# Patient Record
Sex: Female | Born: 1955 | Race: Asian | Hispanic: No | Smoking: Never smoker
Health system: Southern US, Community
[De-identification: ages and names within clinical notes are randomized; demographics above are authoritative.]

## PROBLEM LIST (undated history)

## (undated) DIAGNOSIS — R51 Headache: Secondary | ICD-10-CM

## (undated) DIAGNOSIS — I10 Essential (primary) hypertension: Secondary | ICD-10-CM

## (undated) DIAGNOSIS — S5291XA Unspecified fracture of right forearm, initial encounter for closed fracture: Secondary | ICD-10-CM

## (undated) HISTORY — PX: ABDOMINAL HYSTERECTOMY: SHX81

---

## 1998-02-20 ENCOUNTER — Other Ambulatory Visit: Admission: RE | Admit: 1998-02-20 | Discharge: 1998-02-20 | Payer: Self-pay | Admitting: *Deleted

## 1998-04-10 ENCOUNTER — Ambulatory Visit (HOSPITAL_COMMUNITY): Admission: RE | Admit: 1998-04-10 | Discharge: 1998-04-10 | Payer: Self-pay | Admitting: *Deleted

## 1998-06-05 ENCOUNTER — Ambulatory Visit (HOSPITAL_COMMUNITY): Admission: RE | Admit: 1998-06-05 | Discharge: 1998-06-05 | Payer: Self-pay | Admitting: *Deleted

## 1999-06-24 ENCOUNTER — Other Ambulatory Visit: Admission: RE | Admit: 1999-06-24 | Discharge: 1999-06-24 | Payer: Self-pay | Admitting: *Deleted

## 1999-07-03 ENCOUNTER — Encounter: Payer: Self-pay | Admitting: *Deleted

## 1999-07-03 ENCOUNTER — Ambulatory Visit (HOSPITAL_COMMUNITY): Admission: RE | Admit: 1999-07-03 | Discharge: 1999-07-03 | Payer: Self-pay | Admitting: *Deleted

## 2000-02-25 ENCOUNTER — Encounter: Payer: Self-pay | Admitting: Emergency Medicine

## 2000-02-25 ENCOUNTER — Emergency Department (HOSPITAL_COMMUNITY): Admission: EM | Admit: 2000-02-25 | Discharge: 2000-02-25 | Payer: Self-pay | Admitting: Emergency Medicine

## 2000-06-30 ENCOUNTER — Other Ambulatory Visit: Admission: RE | Admit: 2000-06-30 | Discharge: 2000-06-30 | Payer: Self-pay | Admitting: *Deleted

## 2000-07-03 ENCOUNTER — Encounter: Payer: Self-pay | Admitting: *Deleted

## 2000-07-03 ENCOUNTER — Ambulatory Visit (HOSPITAL_COMMUNITY): Admission: RE | Admit: 2000-07-03 | Discharge: 2000-07-03 | Payer: Self-pay | Admitting: *Deleted

## 2001-07-08 ENCOUNTER — Other Ambulatory Visit: Admission: RE | Admit: 2001-07-08 | Discharge: 2001-07-08 | Payer: Self-pay | Admitting: *Deleted

## 2001-09-14 ENCOUNTER — Ambulatory Visit (HOSPITAL_COMMUNITY): Admission: RE | Admit: 2001-09-14 | Discharge: 2001-09-14 | Payer: Self-pay | Admitting: *Deleted

## 2001-09-14 ENCOUNTER — Encounter: Payer: Self-pay | Admitting: *Deleted

## 2002-07-04 ENCOUNTER — Other Ambulatory Visit: Admission: RE | Admit: 2002-07-04 | Discharge: 2002-07-04 | Payer: Self-pay | Admitting: *Deleted

## 2002-07-12 ENCOUNTER — Encounter: Admission: RE | Admit: 2002-07-12 | Discharge: 2002-07-12 | Payer: Self-pay | Admitting: *Deleted

## 2002-07-12 ENCOUNTER — Encounter: Payer: Self-pay | Admitting: *Deleted

## 2002-10-04 ENCOUNTER — Ambulatory Visit (HOSPITAL_COMMUNITY): Admission: RE | Admit: 2002-10-04 | Discharge: 2002-10-04 | Payer: Self-pay | Admitting: *Deleted

## 2002-10-04 ENCOUNTER — Encounter: Payer: Self-pay | Admitting: *Deleted

## 2003-07-05 ENCOUNTER — Other Ambulatory Visit: Admission: RE | Admit: 2003-07-05 | Discharge: 2003-07-05 | Payer: Self-pay | Admitting: *Deleted

## 2003-10-31 ENCOUNTER — Ambulatory Visit (HOSPITAL_COMMUNITY): Admission: RE | Admit: 2003-10-31 | Discharge: 2003-10-31 | Payer: Self-pay | Admitting: *Deleted

## 2004-10-16 ENCOUNTER — Encounter: Admission: RE | Admit: 2004-10-16 | Discharge: 2004-10-16 | Payer: Self-pay | Admitting: Internal Medicine

## 2004-11-19 ENCOUNTER — Other Ambulatory Visit: Admission: RE | Admit: 2004-11-19 | Discharge: 2004-11-19 | Payer: Self-pay | Admitting: *Deleted

## 2004-12-03 ENCOUNTER — Ambulatory Visit (HOSPITAL_COMMUNITY): Admission: RE | Admit: 2004-12-03 | Discharge: 2004-12-03 | Payer: Self-pay | Admitting: *Deleted

## 2005-11-12 ENCOUNTER — Encounter: Payer: Self-pay | Admitting: *Deleted

## 2006-02-10 ENCOUNTER — Ambulatory Visit (HOSPITAL_COMMUNITY): Admission: RE | Admit: 2006-02-10 | Discharge: 2006-02-10 | Payer: Self-pay | Admitting: *Deleted

## 2006-09-12 ENCOUNTER — Emergency Department (HOSPITAL_COMMUNITY): Admission: EM | Admit: 2006-09-12 | Discharge: 2006-09-12 | Payer: Self-pay | Admitting: Emergency Medicine

## 2008-01-20 ENCOUNTER — Other Ambulatory Visit: Admission: RE | Admit: 2008-01-20 | Discharge: 2008-01-20 | Payer: Self-pay | Admitting: Gynecology

## 2011-06-14 ENCOUNTER — Encounter: Payer: Self-pay | Admitting: *Deleted

## 2011-06-14 ENCOUNTER — Emergency Department (HOSPITAL_COMMUNITY)
Admission: EM | Admit: 2011-06-14 | Discharge: 2011-06-15 | Disposition: A | Discharge status: Home / Self Care | Payer: Managed Care, Other (non HMO) | Attending: Emergency Medicine | Admitting: Emergency Medicine

## 2011-06-14 DIAGNOSIS — Z79899 Other long term (current) drug therapy: Secondary | ICD-10-CM | POA: Insufficient documentation

## 2011-06-14 DIAGNOSIS — I10 Essential (primary) hypertension: Secondary | ICD-10-CM | POA: Insufficient documentation

## 2011-06-14 DIAGNOSIS — R110 Nausea: Secondary | ICD-10-CM | POA: Insufficient documentation

## 2011-06-14 HISTORY — DX: Essential (primary) hypertension: I10

## 2011-06-14 MED ORDER — ONDANSETRON 4 MG PO TBDP
4.0000 mg | ORAL_TABLET | Freq: Once | ORAL | Status: AC
  Administered 2011-06-14: 4 mg via ORAL
  Filled 2011-06-14: qty 1

## 2011-06-14 NOTE — ED Provider Notes (Signed)
History     CSN: [id number] Arrival date & time: 06/14/2011  5:17 PM   First MD Initiated Contact with Patient 06/14/11 2236      Chief Complaint  Patient presents with  . Hypertension    (Consider location/radiation/quality/duration/timing/severity/associated sxs/prior treatment) HPI Patient states she had an episode of nausea today at around 2:30 PM. Patient checked her blood pressure and noticed it was 180/120. Patient came to emergently evaluated. She states she's been feeling better although does a little bit of nausea at times. Patient denies any chest pain. She denies shortness of breath. She denies abdominal pain, fever, vomiting, diarrhea. Patient denies any focal numbness or weakness. She does have a history of high blood pressure and did take her medications today.   Diagnosis Date  . Hypertension    Past Surgical History  Procedure Date  . Abdominal hysterectomy     No family history on file.  History  Substance Use Topics  . Smoking status: Never Smoker   . Smokeless tobacco: Not on file  . Alcohol Use: No    OB History    Grav Para Term Preterm Abortions TAB SAB Ect Mult Living                  Review of Systems  All other systems reviewed and are negative.    Allergies  Review of patient's allergies indicates no known allergies.  Home Medications   Current Outpatient Rx  Name Route Sig Dispense Refill  . L-METHYLFOLATE-B12-B6-B2 12-12-48-5 MG PO TABS Oral Take 1 tablet by mouth daily.      Marland Kitchen METOPROLOL SUCCINATE ER 50 MG PO TB24 Oral Take 50 mg by mouth daily.      . TRIAMTERENE-HCTZ 37.5-25 MG PO TABS Oral Take 1 tablet by mouth daily.        BP 133/74  Pulse 60  Temp(Src) 98 F (36.7 C) (Oral)  Resp 20  SpO2 99%  Physical Exam  Nursing note and vitals reviewed. Constitutional: She is oriented to person, place, and time. She appears well-developed and well-nourished. No distress.  HENT:  Head: Normocephalic and atraumatic.  Right  Ear: External ear normal.  Left Ear: External ear normal.  Mouth/Throat: Oropharynx is clear and moist.  Eyes: Conjunctivae are normal. Right eye exhibits no discharge. Left eye exhibits no discharge. No scleral icterus.  Neck: Neck supple. No tracheal deviation present.  Cardiovascular: Normal rate, regular rhythm and intact distal pulses.   Pulmonary/Chest: Effort normal and breath sounds normal. No stridor. No respiratory distress. She has no wheezes. She has no rales.  Abdominal: Soft. Bowel sounds are normal. She exhibits no distension. There is no tenderness. There is no rebound and no guarding.  Musculoskeletal: She exhibits no edema and no tenderness.  Neurological: She is alert and oriented to person, place, and time. She has normal strength. No cranial nerve deficit ( no gross defecits noted) or sensory deficit. She exhibits normal muscle tone. She displays no seizure activity. Coordination normal.       No pronator drift bilateral upper extrem, able to hold both legs off bed for 5 seconds, sensation intact in all extremities, no visual field cuts, no left or right sided neglect  Skin: Skin is warm and dry. No rash noted.  Psychiatric: She has a normal mood and affect.    ED Course  Procedures (including critical care time)  Date: 06/14/2011  Rate: 60  Rhythm: normal sinus rhythm  QRS Axis: normal  Intervals: normal  ST/T Wave abnormalities: normal  Conduction Disutrbances:none  Narrative Interpretation:   Old EKG Reviewed: none available    Labs Reviewed  I-STAT, CHEM 8  istat normal    MDM  Patient presents with nausea and elevated blood pressure. While she's been in the emergency room her blood pressures remained only mildly elevated. She does not have any evidence of end organ ischemia. There are no signs of stroke. Patient is not having any abdominal pain. She will be discharged home with a prescription for Zofran for nausea. She is encouraged to followup with her  primary care doctor to be rechecked next week if the symptoms do not resolve.        Celene Kras, MD 06/15/11 [phone]

## 2011-06-14 NOTE — ED Notes (Signed)
Pt states she felt nauseous and thought her blood pressure was high. Pt states she took her blood pressure at home with a systolic >180. the patient has has not had a bp>140 while being in the ED. the patient is in no apparent distress at this time. Will continue to monitor

## 2011-06-14 NOTE — ED Notes (Signed)
Pt felt nauseated around 2:30 today, checked bp 180/120, in triage 156/90

## 2011-06-15 LAB — POCT I-STAT, CHEM 8
Chloride: 101 mEq/L (ref 96–112)
Creatinine, Ser: 0.9 mg/dL (ref 0.50–1.10)

## 2011-06-15 MED ORDER — ONDANSETRON 4 MG PO TBDP: 4.0000 mg | ORAL_TABLET | Freq: Three times a day (TID) | ORAL | Status: AC | PRN

## 2011-06-16 ENCOUNTER — Emergency Department (HOSPITAL_COMMUNITY)
Admission: EM | Admit: 2011-06-16 | Discharge: 2011-06-16 | Discharge status: Against Medical Advice | Payer: Managed Care, Other (non HMO) | Attending: Emergency Medicine | Admitting: Emergency Medicine

## 2011-06-16 DIAGNOSIS — R030 Elevated blood-pressure reading, without diagnosis of hypertension: Secondary | ICD-10-CM | POA: Insufficient documentation

## 2011-06-16 NOTE — ED Notes (Signed)
Pt called for triage in all waiting rooms without answer.

## 2013-01-28 ENCOUNTER — Other Ambulatory Visit (HOSPITAL_COMMUNITY): Payer: Self-pay | Admitting: Gynecology

## 2013-01-28 DIAGNOSIS — Z1231 Encounter for screening mammogram for malignant neoplasm of breast: Secondary | ICD-10-CM

## 2013-02-15 ENCOUNTER — Ambulatory Visit (HOSPITAL_COMMUNITY)
Admission: RE | Admit: 2013-02-15 | Discharge: 2013-02-15 | Disposition: A | Discharge status: Home / Self Care | Payer: BC Managed Care – PPO | Source: Ambulatory Visit | Attending: Gynecology | Admitting: Gynecology

## 2013-02-15 DIAGNOSIS — Z1231 Encounter for screening mammogram for malignant neoplasm of breast: Secondary | ICD-10-CM | POA: Insufficient documentation

## 2013-09-02 ENCOUNTER — Emergency Department (HOSPITAL_COMMUNITY): Payer: 59

## 2013-09-02 ENCOUNTER — Encounter (HOSPITAL_COMMUNITY): Payer: Self-pay | Admitting: Emergency Medicine

## 2013-09-02 ENCOUNTER — Emergency Department (HOSPITAL_COMMUNITY)
Admission: EM | Admit: 2013-09-02 | Discharge: 2013-09-02 | Disposition: A | Discharge status: Home / Self Care | Payer: 59 | Attending: Emergency Medicine | Admitting: Emergency Medicine

## 2013-09-02 ENCOUNTER — Encounter (HOSPITAL_BASED_OUTPATIENT_CLINIC_OR_DEPARTMENT_OTHER): Payer: Self-pay | Admitting: *Deleted

## 2013-09-02 DIAGNOSIS — S52599A Other fractures of lower end of unspecified radius, initial encounter for closed fracture: Secondary | ICD-10-CM | POA: Insufficient documentation

## 2013-09-02 DIAGNOSIS — S52501A Unspecified fracture of the lower end of right radius, initial encounter for closed fracture: Secondary | ICD-10-CM

## 2013-09-02 DIAGNOSIS — Y939 Activity, unspecified: Secondary | ICD-10-CM | POA: Insufficient documentation

## 2013-09-02 DIAGNOSIS — Y929 Unspecified place or not applicable: Secondary | ICD-10-CM | POA: Insufficient documentation

## 2013-09-02 DIAGNOSIS — S0990XA Unspecified injury of head, initial encounter: Secondary | ICD-10-CM | POA: Insufficient documentation

## 2013-09-02 DIAGNOSIS — W19XXXA Unspecified fall, initial encounter: Secondary | ICD-10-CM

## 2013-09-02 DIAGNOSIS — I10 Essential (primary) hypertension: Secondary | ICD-10-CM | POA: Insufficient documentation

## 2013-09-02 DIAGNOSIS — W010XXA Fall on same level from slipping, tripping and stumbling without subsequent striking against object, initial encounter: Secondary | ICD-10-CM | POA: Insufficient documentation

## 2013-09-02 MED ORDER — HYDROCODONE-ACETAMINOPHEN 5-325 MG PO TABS
2.0000 | ORAL_TABLET | Freq: Once | ORAL | Status: AC
  Administered 2013-09-02: 2 via ORAL
  Filled 2013-09-02: qty 2

## 2013-09-02 NOTE — Progress Notes (Addendum)
UNABLE TO REACH PT.  LM ON HER VOICEMAIL [phone].  NPO AFTER MN. ARRIVE AT 0700. NEEDS H&P , ISTAT AND EKG. NOTED PT TAKES METOPROLOL INSTRUCTED PT TO TAKES THIS AM DOS W/ SIP OF WATER.

## 2013-09-02 NOTE — ED Notes (Addendum)
Pt reports she fell this morning on the ice. Hit back of her head, denies LOC. Denies blurry vision or dizziness. Reports right wrist pain, deformity noted. Strong radial pulse, pain 10/10. Barely able to wiggle fingers.

## 2013-09-02 NOTE — ED Provider Notes (Signed)
CSN: [id number]     Arrival date & time 09/02/13  0809 History   First MD Initiated Contact with Patient 09/02/13 0827     Chief Complaint  Patient presents with  . Fall  . Wrist Pain  . hit head, no LOC     HPI  Morgan Gross is a 58 y.o. female with no PMH who presents to the ED for evaluation of a fall and wrist pain. History was provided by the patient. Patient states that she slipped and fell at 7:00 AM this morning on the ice. She complains of right wrist pain and a headache. Wrist pain worse with movement. She states she "hit her head hard" and cannot conclusively state she did not have LOC. No anticoagulation. She has a mild localized headache in the back of her head. No vision changes, neck pain, weakness, confusion, loss of sensation, numbness/tingling. She denies any back pain, abdominal pain, nausea, emesis, chest pain, SOB, dizziness, or lightheadedness. She did not take her BP medications today because the fall occurred prior to her breakfast when she usually takes them. She did not take anything for pain PTA.    Past Medical History  Diagnosis Date  . Hypertension    Past Surgical History  Procedure Laterality Date  . Abdominal hysterectomy     History reviewed. No pertinent family history. History  Substance Use Topics  . Smoking status: Never Smoker   . Smokeless tobacco: Not on file  . Alcohol Use: No   OB History   Grav Para Term Preterm Abortions TAB SAB Ect Mult Living                 Review of Systems  HENT: Negative for dental problem.   Eyes: Negative for photophobia and visual disturbance.  Respiratory: Negative for shortness of breath.   Cardiovascular: Negative for chest pain.  Gastrointestinal: Negative for nausea, vomiting and abdominal pain.  Musculoskeletal: Positive for arthralgias and joint swelling. Negative for back pain, gait problem, myalgias, neck pain and neck stiffness.  Skin: Negative for wound.  Neurological: Positive for headaches.  Negative for dizziness, syncope, weakness, light-headedness and numbness.  Psychiatric/Behavioral: Negative for confusion.    Allergies  Review of patient's allergies indicates no known allergies.  Home Medications   Current Outpatient Rx  Name  Route  Sig  Dispense  Refill  . cholecalciferol (VITAMIN D) 1000 UNITS tablet   Oral   Take 1,000 Units by mouth 2 (two) times daily.         . metoprolol (TOPROL-XL) 50 MG 24 hr tablet   Oral   Take 50 mg by mouth daily.           Marland Kitchen. triamterene-hydrochlorothiazide (MAXZIDE-25) 37.5-25 MG per tablet   Oral   Take 1 tablet by mouth daily.            BP 174/90  Pulse 72  Temp(Src) 98.2 F (36.8 C) (Oral)  Resp 16  SpO2 98%  Filed Vitals:   09/02/13 0819 09/02/13 0853  BP: 174/90 159/85  Pulse: 72 78  Temp: 98.2 F (36.8 C)   TempSrc: Oral   Resp: 16 12  SpO2: 98% 100%    Physical Exam  Nursing note and vitals reviewed. Constitutional: She is oriented to person, place, and time. She appears well-developed and well-nourished. No distress.  HENT:  Head: Normocephalic.    Right Ear: External ear normal.  Left Ear: External ear normal.  Nose: Nose normal.  Mouth/Throat: Oropharynx  is clear and moist.  Tenderness to palpation to the middle occiput with no palpable hematoma, step-offs, or lacerations throughout.  Tympanic membranes gray and translucent bilaterally.    Eyes: Conjunctivae and EOM are normal. Pupils are equal, round, and reactive to light. Right eye exhibits no discharge. Left eye exhibits no discharge.  Neck: Normal range of motion. Neck supple.  No cervical spinal or paraspinal tenderness to palpation throughout.  No limitations with neck ROM.    Cardiovascular: Normal rate, regular rhythm, normal heart sounds and intact distal pulses.  Exam reveals no gallop and no friction rub.   No murmur heard. Radial pulses present and equal bilaterally  Pulmonary/Chest: Effort normal and breath sounds normal. No  respiratory distress. She has no wheezes. She has no rales. She exhibits no tenderness.  Abdominal: Soft. She exhibits no distension. There is no tenderness.  Musculoskeletal: Normal range of motion. She exhibits edema and tenderness.       Arms: Tenderness to palpation to the right anterior wrist with obvious deformity. No open wounds. Patient able to flex and extend digits of right hand. No hand, elbow, or shoulder tenderness on the right. No snuffbox tenderness on the right. No tenderness to palpation to the thoracic or lumbar spinous processes throughout.  No tenderness to palpation to the paraspinal muscles throughout. Patient able to ambulate without difficulty or ataxia. Strength 5/5 in the upper and lower extremities bilaterally.    Neurological: She is alert and oriented to person, place, and time.  Sensation intact in the right hand. GCS 15.  No focal neurological deficits.  CN 2-12 intact.    Skin: Skin is warm and dry. She is not diaphoretic.    ED Course  Procedures (including critical care time) Labs Review Labs Reviewed - No data to display Imaging Review No results found.  EKG Interpretation   None           CT Head Wo Contrast (Final result)  Result time: 09/02/13 08:58:52    Final result by Rad Results In Interface (09/02/13 08:58:52)    Narrative:   CLINICAL DATA: Recent traumatic injury with headaches  EXAM: CT HEAD WITHOUT CONTRAST  TECHNIQUE: Contiguous axial images were obtained from the base of the skull through the vertex without intravenous contrast.  COMPARISON: None.  FINDINGS: The bony calvarium is intact. The ventricles are of normal size and configuration. No findings to suggest acute hemorrhage, acute infarction or space-occupying mass lesion noted.  IMPRESSION: No acute abnormality seen.   Electronically Signed By: Alcide Clever M.D. On: 09/02/2013 08:58             DG Wrist Complete Right (Final result)  Result time: 09/02/13  08:52:54    Final result by Rad Results In Interface (09/02/13 08:52:54)    Narrative:   CLINICAL DATA: 58 year old female status post fall with pain and swelling. Initial encounter.  EXAM: RIGHT WRIST - COMPLETE 3+ VIEW  COMPARISON: None.  FINDINGS: Impacted fracture of the distal right radius meta diaphysis is mildly comminuted. There is mild to moderate dorsal angulation. There does appear to be radiocarpal joint involvement on the lateral view. DRU involvement. Distal ulna intact with tiny chronic appearing fragment adjacent to the ulnar styloid. Carpal bone alignment preserved. Scaphoid intact. Metacarpals intact.  IMPRESSION: Mildly comminuted, impacted, and dorsally angulated distal right radius fracture.   Electronically Signed By: Augusto Gamble M.D. On: 09/02/2013 08:52         MDM   Malaysha CHRISTEL BAI is a  58 y.o. female with no PMH who presents to the ED for evaluation of a fall and wrist pain. Patient states she hit her head during her fall with unknown LOC. Head CT negative for an acute intracranial process. No neurological deficits. Patient also had wrist pain and was found to have a right distal radius fracture. Hand surgery consulted and will reduce fracture and splint patient in clinic. Patient has visitor in ED to drive patient immediately to the clinic for further management. Patient neurovascularly intact. Spoke with patient who is in agreement with discharge and plan. BP mildly elevated due to pain and patient did not take BP medications this morning. Encouraged to take medications when she arrives home.    Consults  9:45 AM = Spoke with Dr. Melvyn Novas with hand surgery who can see patient in clinic. Can discharge over by private vehicle for reduction and splint placement in clinic. Patient may require surgery for this fx.    Discharge Medication List as of 09/02/2013  9:48 AM       Final impressions: 1. Distal radius fracture, right   2. Fall   3. Head injury        Luiz Iron PA-C            Jillyn Ledger, PA-C 09/02/13 [phone]

## 2013-09-02 NOTE — ED Notes (Signed)
Pt escorted to discharge window. Pt verbalized understanding discharge instructions. In no acute distress.  

## 2013-09-02 NOTE — Discharge Instructions (Signed)
Do directly to orthopedics after discharge for further care of your fracture Return to the emergency department if you develop any changing/worsening condition, severe headache, difficulty walking/speaking, weakness, loss of sensation, repeated vomiting, abdominal pain, back pain, chest pain, or any other concerns (please read additional information regarding your condition below)    Wrist Fracture A wrist fracture is a break in one of the bones of the wrist. Your wrist is made up of several small bones at the palm of your hand (carpal bones) and the two bones that make up your forearm (radius and ulna). The bones come together to form multiple large and small joints. The shape and design of these joints allow your wrist to bend and straighten, move side-to-side, and rotate, as in twisting your palm up or down. CAUSES  A fracture may occur in any of the bones in your wrist when enough force is applied to the wrist, such as when falling down onto an outstretched hand. Severe injuries may occur from a more forceful injury. SYMPTOMS Symptoms of wrist fractures include tenderness, bruising, and swelling. Additionally, the wrist may hang in an odd position or may be misshaped. DIAGNOSIS To diagnose a wrist fracture, your caregiver will physically examine your wrist. Your caregiver may also request an X-ray exam of your wrist. TREATMENT Treatment depends on many factors, including the nature and location of the fracture, your age, and your activity level. Treatment for wrist fracture can be nonsurgical or surgical. For nonsurgical treatment, a plaster cast or splint may be applied to your wrist if the bone is in a good position (aligned). The cast will stay on for about 6 weeks. If the alignment of your bone is not good, it may be necessary to realign (reduce) it. After the bone is reduced, a splint usually is placed on your wrist to allow for a small amount of normal swelling. After about 1 week, the  splint is removed and a cast is added. The cast is removed 2 or 3 weeks later, after the swelling goes down, causing the cast to loosen. Another cast is applied. This cast is removed after about another 2 or 3 weeks, for a total of 4 to 6 weeks of immobilization. Sometimes the position of the bone is so far out of place that surgery is required to apply a device to hold it together as it heals. If the bone cannot be reduced without cutting the skin around the bone (closed reduction), a cut (incision) is made to allow direct access to the bone to reduce it (open reduction). Depending on the fracture, there are a number of options for holding the bone in place while it heals, including a cast, metal pins, a plate and screws, and a device called an external fixator. With an external fixator, most of the hardware remains outside of the body. HOME CARE INSTRUCTIONS  To lessen swelling, keep your injured wrist elevated and move your fingers as much as possible.  Apply ice to your wrist for the first 1 to 2 days after you have been treated or as directed by your caregiver. Applying ice helps to reduce inflammation and pain.  Put ice in a plastic bag.  Place a towel between your skin and the bag.  Leave the ice on for 15 to 20 minutes at a time, every 2 hours while you are awake.  Do not put pressure on any part of your cast or splint. It may break.  Use a plastic bag to protect  your cast or splint from water while bathing or showering. Do not lower your cast or splint into water.  Only take over-the-counter or prescription medicines for pain as directed by your caregiver. SEEK IMMEDIATE MEDICAL CARE IF:   Your cast or splint gets damaged or breaks.  You have continued severe pain or more swelling than you did before the cast was put on.  Your skin or fingernails below the injury turn blue or gray or feel cold or numb.  You develop decreased feeling in your fingers. MAKE SURE YOU:  Understand  these instructions.  Will watch your condition.  Will get help right away if you are not doing well or get worse. Document Released: 04/09/2005 Document Revised: 09/22/2011 Document Reviewed: 07/18/2011 [facility] Patient Information 2014 [place], [place].  Head Injury, Adult You have received a head injury. It does not appear serious at this time. Headaches and vomiting are common following head injury. It should be easy to awaken from sleeping. Sometimes it is necessary for you to stay in the emergency department for a while for observation. Sometimes admission to the hospital may be needed. After injuries such as yours, most problems occur within the first 24 hours, but side effects may occur up to 7 10 days after the injury. It is important for you to carefully monitor your condition and contact your health care provider or seek immediate medical care if there is a change in your condition. WHAT ARE THE TYPES OF HEAD INJURIES? Head injuries can be as minor as a bump. Some head injuries can be more severe. More severe head injuries include:  A jarring injury to the brain (concussion).  A bruise of the brain (contusion). This mean there is bleeding in the brain that can cause swelling.  A cracked skull (skull fracture).  Bleeding in the brain that collects, clots, and forms a bump (hematoma). WHAT CAUSES A HEAD INJURY? A serious head injury is most likely to happen to someone who is in a car wreck and is not wearing a seat belt. Other causes of major head injuries include bicycle or motorcycle accidents, sports injuries, and falls. HOW ARE HEAD INJURIES DIAGNOSED? A complete history of the event leading to the injury and your current symptoms will be helpful in diagnosing head injuries. Many times, pictures of the brain, such as CT or MRI are needed to see the extent of the injury. Often, an overnight hospital stay is necessary for observation.  WHEN SHOULD I SEEK IMMEDIATE MEDICAL CARE?  You  should get help right away if:  You have confusion or drowsiness.  You feel sick to your stomach (nauseous) or have continued, forceful vomiting.  You have dizziness or unsteadiness that is getting worse.  You have severe, continued headaches not relieved by medicine. Only take over-the-counter or prescription medicines for pain, fever, or discomfort as directed by your health care provider.  You do not have normal function of the arms or legs or are unable to walk.  You notice changes in the black spots in the center of the colored part of your eye (pupil).  You have a clear or bloody fluid coming from your nose or ears.  You have a loss of vision. During the next 24 hours after the injury, you must stay with someone who can watch you for the warning signs. This person should contact local emergency services (911 in the [place] if you have seizures, you become unconscious, or you are unable to wake up. HOW CAN  I PREVENT A HEAD INJURY IN THE FUTURE? The most important factor for preventing major head injuries is avoiding motor vehicle accidents. To minimize the potential for damage to your head, it is crucial to wear seat belts while riding in motor vehicles. Wearing helmets while bike riding and playing collision sports (like football) is also helpful. Also, avoiding dangerous activities around the house will further help reduce your risk of head injury.  WHEN CAN I RETURN TO NORMAL ACTIVITIES AND ATHLETICS? You should be reevaluated by your health care provider before returning to these activities. If you have any of the following symptoms, you should not return to activities or contact sports until 1 week after the symptoms have stopped:  Persistent headache.  Dizziness or vertigo.  Poor attention and concentration.  Confusion.  Memory problems.  Nausea or vomiting.  Fatigue or tire easily.  Irritability.  Intolerant of bright lights or loud noises.  Anxiety or  depression.  Disturbed sleep. MAKE SURE YOU:   Understand these instructions.  Will watch your condition.  Will get help right away if you are not doing well or get worse. Document Released: 06/30/2005 Document Revised: 04/20/2013 Document Reviewed: 03/07/2013 [facility] Patient Information 2014 [place], [place]

## 2013-09-02 NOTE — ED Provider Notes (Signed)
Medical screening examination/treatment/procedure(s) were performed by non-physician practitioner and as supervising physician I was immediately available for consultation/collaboration.  EKG Interpretation   None         Layla MawKristen N Leelynn Whetsel, DO 09/02/13 1535

## 2013-09-02 NOTE — ED Notes (Signed)
Bed: WA25 Expected date:  Expected time:  Means of arrival:  Comments: 

## 2013-09-05 ENCOUNTER — Encounter (HOSPITAL_BASED_OUTPATIENT_CLINIC_OR_DEPARTMENT_OTHER): Payer: 59 | Admitting: Anesthesiology

## 2013-09-05 ENCOUNTER — Ambulatory Visit (HOSPITAL_BASED_OUTPATIENT_CLINIC_OR_DEPARTMENT_OTHER)
Admission: RE | Admit: 2013-09-05 | Discharge: 2013-09-06 | Disposition: A | Discharge status: Home / Self Care | Payer: 59 | Source: Ambulatory Visit | Attending: Orthopedic Surgery | Admitting: Orthopedic Surgery

## 2013-09-05 ENCOUNTER — Encounter (HOSPITAL_COMMUNITY)
Admission: RE | Disposition: A | Discharge status: Home / Self Care | Payer: Self-pay | Source: Ambulatory Visit | Attending: Orthopedic Surgery

## 2013-09-05 ENCOUNTER — Ambulatory Visit (HOSPITAL_BASED_OUTPATIENT_CLINIC_OR_DEPARTMENT_OTHER): Payer: 59 | Admitting: Anesthesiology

## 2013-09-05 ENCOUNTER — Encounter (HOSPITAL_BASED_OUTPATIENT_CLINIC_OR_DEPARTMENT_OTHER): Payer: Self-pay | Admitting: *Deleted

## 2013-09-05 DIAGNOSIS — I10 Essential (primary) hypertension: Secondary | ICD-10-CM | POA: Insufficient documentation

## 2013-09-05 DIAGNOSIS — W19XXXA Unspecified fall, initial encounter: Secondary | ICD-10-CM | POA: Insufficient documentation

## 2013-09-05 DIAGNOSIS — S52599A Other fractures of lower end of unspecified radius, initial encounter for closed fracture: Secondary | ICD-10-CM | POA: Insufficient documentation

## 2013-09-05 DIAGNOSIS — S52501A Unspecified fracture of the lower end of right radius, initial encounter for closed fracture: Secondary | ICD-10-CM | POA: Diagnosis present

## 2013-09-05 HISTORY — DX: Unspecified fracture of right forearm, initial encounter for closed fracture: S52.91XA

## 2013-09-05 HISTORY — PX: OPEN REDUCTION INTERNAL FIXATION (ORIF) DISTAL RADIAL FRACTURE: SHX5989

## 2013-09-05 HISTORY — DX: Headache: R51

## 2013-09-05 LAB — POCT I-STAT 4, (NA,K, GLUC, HGB,HCT)
Glucose, Bld: 107 mg/dL — ABNORMAL HIGH (ref 70–99)
HCT: 46 % (ref 36.0–46.0)
Hemoglobin: 15.6 g/dL — ABNORMAL HIGH (ref 12.0–15.0)
Potassium: 3.5 mEq/L — ABNORMAL LOW (ref 3.7–5.3)
Sodium: 143 mEq/L (ref 137–147)

## 2013-09-05 SURGERY — OPEN REDUCTION INTERNAL FIXATION (ORIF) DISTAL RADIUS FRACTURE
Anesthesia: General | Site: Wrist | Laterality: Right

## 2013-09-05 MED ORDER — HYDROCODONE-ACETAMINOPHEN 5-325 MG PO TABS: 1.0000 | ORAL_TABLET | ORAL | Status: DC | PRN

## 2013-09-05 MED ORDER — DOCUSATE SODIUM 100 MG PO CAPS: 100.0000 mg | ORAL_CAPSULE | Freq: Two times a day (BID) | ORAL | Status: AC

## 2013-09-05 MED ORDER — BUPIVACAINE HCL (PF) 0.25 % IJ SOLN
INTRAMUSCULAR | Status: DC | PRN
  Administered 2013-09-05: 9 mL

## 2013-09-05 MED ORDER — MIDAZOLAM HCL 5 MG/5ML IJ SOLN
INTRAMUSCULAR | Status: DC | PRN
  Administered 2013-09-05: 1 mg via INTRAVENOUS

## 2013-09-05 MED ORDER — ONDANSETRON HCL 4 MG PO TABS: 4.0000 mg | ORAL_TABLET | Freq: Three times a day (TID) | ORAL | Status: AC | PRN

## 2013-09-05 MED ORDER — LACTATED RINGERS IV SOLN
INTRAVENOUS | Status: DC | PRN
  Administered 2013-09-05 (×2): via INTRAVENOUS

## 2013-09-05 MED ORDER — HYDROMORPHONE HCL PF 1 MG/ML IJ SOLN
INTRAMUSCULAR | Status: AC
  Filled 2013-09-05: qty 1

## 2013-09-05 MED ORDER — LIDOCAINE HCL (CARDIAC) 20 MG/ML IV SOLN
INTRAVENOUS | Status: DC | PRN
  Administered 2013-09-05: 40 mg via INTRAVENOUS

## 2013-09-05 MED ORDER — FENTANYL CITRATE 0.05 MG/ML IJ SOLN
INTRAMUSCULAR | Status: DC | PRN
  Administered 2013-09-05: 25 ug via INTRAVENOUS
  Administered 2013-09-05: 50 ug via INTRAVENOUS
  Administered 2013-09-05 (×9): 25 ug via INTRAVENOUS

## 2013-09-05 MED ORDER — LACTATED RINGERS IV SOLN
INTRAVENOUS | Status: DC
  Administered 2013-09-05: 08:00:00 via INTRAVENOUS
  Filled 2013-09-05: qty 1000

## 2013-09-05 MED ORDER — LIP MEDEX EX OINT
TOPICAL_OINTMENT | CUTANEOUS | Status: AC
  Administered 2013-09-05: 13:00:00
  Filled 2013-09-05: qty 7

## 2013-09-05 MED ORDER — CEFAZOLIN SODIUM-DEXTROSE 2-3 GM-% IV SOLR
2.0000 g | INTRAVENOUS | Status: DC
  Filled 2013-09-05: qty 50

## 2013-09-05 MED ORDER — METHOCARBAMOL 500 MG PO TABS: 500.0000 mg | ORAL_TABLET | Freq: Four times a day (QID) | ORAL | Status: DC | PRN

## 2013-09-05 MED ORDER — LABETALOL HCL 5 MG/ML IV SOLN
INTRAVENOUS | Status: DC | PRN
  Administered 2013-09-05: 5 mg via INTRAVENOUS

## 2013-09-05 MED ORDER — ONDANSETRON HCL 4 MG/2ML IJ SOLN: 4.0000 mg | Freq: Four times a day (QID) | INTRAMUSCULAR | Status: DC | PRN

## 2013-09-05 MED ORDER — CHLORHEXIDINE GLUCONATE 4 % EX LIQD
60.0000 mL | Freq: Once | CUTANEOUS | Status: DC
Start: 2013-09-05 — End: 2013-09-05
  Filled 2013-09-05: qty 60

## 2013-09-05 MED ORDER — CEFAZOLIN SODIUM 1-5 GM-% IV SOLN
1.0000 g | Freq: Three times a day (TID) | INTRAVENOUS | Status: DC
  Administered 2013-09-05 – 2013-09-06 (×2): 1 g via INTRAVENOUS
  Filled 2013-09-05 (×4): qty 50

## 2013-09-05 MED ORDER — FENTANYL CITRATE 0.05 MG/ML IJ SOLN
INTRAMUSCULAR | Status: AC
  Filled 2013-09-05: qty 6

## 2013-09-05 MED ORDER — ADULT MULTIVITAMIN W/MINERALS CH
1.0000 | ORAL_TABLET | Freq: Every day | ORAL | Status: DC
  Filled 2013-09-05 (×2): qty 1

## 2013-09-05 MED ORDER — OXYCODONE-ACETAMINOPHEN 5-325 MG PO TABS: 1.0000 | ORAL_TABLET | ORAL | Status: AC | PRN

## 2013-09-05 MED ORDER — KCL IN DEXTROSE-NACL 20-5-0.45 MEQ/L-%-% IV SOLN
INTRAVENOUS | Status: DC
  Administered 2013-09-05: 14:00:00 via INTRAVENOUS
  Filled 2013-09-05 (×3): qty 1000

## 2013-09-05 MED ORDER — VITAMIN C 500 MG PO TABS
1000.0000 mg | ORAL_TABLET | Freq: Every day | ORAL | Status: DC
  Filled 2013-09-05 (×2): qty 2

## 2013-09-05 MED ORDER — METHOCARBAMOL 500 MG PO TABS: 500.0000 mg | ORAL_TABLET | Freq: Four times a day (QID) | ORAL | Status: AC

## 2013-09-05 MED ORDER — DIPHENHYDRAMINE HCL 25 MG PO CAPS: 25.0000 mg | ORAL_CAPSULE | Freq: Four times a day (QID) | ORAL | Status: DC | PRN

## 2013-09-05 MED ORDER — DEXAMETHASONE SODIUM PHOSPHATE 4 MG/ML IJ SOLN
INTRAMUSCULAR | Status: DC | PRN
  Administered 2013-09-05: 4 mg via INTRAVENOUS

## 2013-09-05 MED ORDER — HYDROMORPHONE HCL PF 1 MG/ML IJ SOLN
0.5000 mg | INTRAMUSCULAR | Status: DC | PRN
  Administered 2013-09-05: 0.5 mg via INTRAVENOUS
  Filled 2013-09-05: qty 1

## 2013-09-05 MED ORDER — MIDAZOLAM HCL 2 MG/2ML IJ SOLN
INTRAMUSCULAR | Status: AC
  Filled 2013-09-05: qty 2

## 2013-09-05 MED ORDER — DOCUSATE SODIUM 100 MG PO CAPS: 100.0000 mg | ORAL_CAPSULE | Freq: Two times a day (BID) | ORAL | Status: DC

## 2013-09-05 MED ORDER — ONDANSETRON HCL 4 MG/2ML IJ SOLN
INTRAMUSCULAR | Status: DC | PRN
  Administered 2013-09-05: 4 mg via INTRAVENOUS

## 2013-09-05 MED ORDER — ONDANSETRON HCL 4 MG PO TABS: 4.0000 mg | ORAL_TABLET | Freq: Four times a day (QID) | ORAL | Status: DC | PRN

## 2013-09-05 MED ORDER — PROPOFOL 10 MG/ML IV BOLUS
INTRAVENOUS | Status: DC | PRN
  Administered 2013-09-05: 180 mg via INTRAVENOUS
  Administered 2013-09-05 (×2): 20 mg via INTRAVENOUS

## 2013-09-05 MED ORDER — OXYCODONE-ACETAMINOPHEN 5-325 MG PO TABS
1.0000 | ORAL_TABLET | ORAL | Status: DC | PRN
  Administered 2013-09-05: 2 via ORAL
  Filled 2013-09-05: qty 2

## 2013-09-05 MED ORDER — METOCLOPRAMIDE HCL 5 MG/ML IJ SOLN
INTRAMUSCULAR | Status: DC | PRN
  Administered 2013-09-05: 10 mg via INTRAVENOUS

## 2013-09-05 MED ORDER — 0.9 % SODIUM CHLORIDE (POUR BTL) OPTIME
TOPICAL | Status: DC | PRN
  Administered 2013-09-05: 1000 mL

## 2013-09-05 MED ORDER — DEXTROSE 5 % IV SOLN
500.0000 mg | Freq: Four times a day (QID) | INTRAVENOUS | Status: DC | PRN
  Filled 2013-09-05: qty 5

## 2013-09-05 MED ORDER — VITAMIN C 500 MG PO TABS: 500.0000 mg | ORAL_TABLET | Freq: Every day | ORAL | Status: AC

## 2013-09-05 MED ORDER — MORPHINE SULFATE 2 MG/ML IJ SOLN: 1.0000 mg | INTRAMUSCULAR | Status: DC | PRN

## 2013-09-05 MED ORDER — CEFAZOLIN SODIUM 1-5 GM-% IV SOLN
1.0000 g | INTRAVENOUS | Status: AC
Start: 2013-09-05 — End: 2013-09-05
  Administered 2013-09-05: 1 g via INTRAVENOUS
  Filled 2013-09-05: qty 50

## 2013-09-05 SURGICAL SUPPLY — 63 items
BANDAGE ELASTIC 3 VELCRO ST LF (GAUZE/BANDAGES/DRESSINGS) ×3 IMPLANT
BANDAGE ELASTIC 4 VELCRO ST LF (GAUZE/BANDAGES/DRESSINGS) ×3 IMPLANT
BANDAGE GAUZE ELAST BULKY 4 IN (GAUZE/BANDAGES/DRESSINGS) ×3 IMPLANT
BLADE SURG 15 STRL LF DISP TIS (BLADE) ×1 IMPLANT
BLADE SURG 15 STRL SS (BLADE) ×2
BNDG ESMARK 4X9 LF (GAUZE/BANDAGES/DRESSINGS) ×3 IMPLANT
BNDG GAUZE ELAST 4 BULKY (GAUZE/BANDAGES/DRESSINGS) ×3 IMPLANT
CANISTER SUCT 1200ML W/VALVE (MISCELLANEOUS) IMPLANT
CORDS BIPOLAR (ELECTRODE) ×3 IMPLANT
COVER TABLE BACK 60X90 (DRAPES) ×3 IMPLANT
CUFF TOURNIQUET SINGLE 18IN (TOURNIQUET CUFF) ×3 IMPLANT
DECANTER SPIKE VIAL GLASS SM (MISCELLANEOUS) IMPLANT
DRAPE EXTREMITY T 121X128X90 (DRAPE) ×3 IMPLANT
DRAPE OEC MINIVIEW 54X84 (DRAPES) ×3 IMPLANT
DRSG EMULSION OIL 3X3 NADH (GAUZE/BANDAGES/DRESSINGS) ×3 IMPLANT
GLOVE BIOGEL PI IND STRL 8.5 (GLOVE) ×1 IMPLANT
GLOVE BIOGEL PI INDICATOR 8.5 (GLOVE) ×2
GLOVE SURG ORTHO 8.0 STRL STRW (GLOVE) ×3 IMPLANT
GOWN STRL REUS W/ TWL XL LVL3 (GOWN DISPOSABLE) ×1 IMPLANT
GOWN STRL REUS W/TWL LRG LVL3 (GOWN DISPOSABLE) ×3 IMPLANT
GOWN STRL REUS W/TWL XL LVL3 (GOWN DISPOSABLE) ×5 IMPLANT
NEEDLE HYPO 25X1 1.5 SAFETY (NEEDLE) ×3 IMPLANT
NS IRRIG 1000ML POUR BTL (IV SOLUTION) ×3 IMPLANT
PACK BASIN DAY SURGERY FS (CUSTOM PROCEDURE TRAY) ×3 IMPLANT
PAD CAST 4YDX4 CTTN HI CHSV (CAST SUPPLIES) ×2 IMPLANT
PADDING CAST ABS 4INX4YD NS (CAST SUPPLIES) ×2
PADDING CAST ABS COTTON 4X4 ST (CAST SUPPLIES) ×1 IMPLANT
PADDING CAST COTTON 4X4 STRL (CAST SUPPLIES) ×4
PEG LOCKING SMOOTH 2.2X18 (Peg) ×9 IMPLANT
PEG LOCKING SMOOTH 2.2X20 (Screw) ×6 IMPLANT
PLATE NARROW DVR RIGHT (Plate) ×3 IMPLANT
SAFETYSPLINT ×3 IMPLANT
SCREW LOCK 12X2.7X 3 LD (Screw) ×4 IMPLANT
SCREW LOCKING 2.7X12MM (Screw) ×8 IMPLANT
SCREW LOCKING 2.7X13MM (Screw) ×3 IMPLANT
SCREW MULTI DIRECTIONAL 2.7X16 (Screw) ×3 IMPLANT
SLEEVE SCD COMPRESS KNEE MED (MISCELLANEOUS) ×3 IMPLANT
SLING ARM FOAM STRAP MED (SOFTGOODS) ×3 IMPLANT
SLING ARM LRG ADULT FOAM STRAP (SOFTGOODS) IMPLANT
SLING ARM MED ADULT FOAM STRAP (SOFTGOODS) IMPLANT
SPLINT FIBERGLASS 3X35 (CAST SUPPLIES) IMPLANT
SPLINT FIBERGLASS 4X30 (CAST SUPPLIES) IMPLANT
SPONGE GAUZE 4X4 12PLY (GAUZE/BANDAGES/DRESSINGS) ×3 IMPLANT
SPONGE GAUZE 4X4 12PLY STER LF (GAUZE/BANDAGES/DRESSINGS) ×3 IMPLANT
STOCKINETTE 4X48 STRL (DRAPES) ×3 IMPLANT
SUCTION FRAZIER TIP 10 FR DISP (SUCTIONS) ×3 IMPLANT
SUT MNCRL AB 3-0 PS2 18 (SUTURE) IMPLANT
SUT MON AB 3-0 SH 27 (SUTURE)
SUT MON AB 3-0 SH27 (SUTURE) IMPLANT
SUT PROLENE 3 0 PS 1 (SUTURE) IMPLANT
SUT PROLENE 4 0 PS 2 18 (SUTURE) ×3 IMPLANT
SUT VIC AB 0 CT1 27 (SUTURE)
SUT VIC AB 0 CT1 27XBRD ANBCTR (SUTURE) IMPLANT
SUT VIC AB 2-0 PS2 27 (SUTURE) ×3 IMPLANT
SUT VIC AB 2-0 SH 27 (SUTURE)
SUT VIC AB 2-0 SH 27XBRD (SUTURE) IMPLANT
SUT VICRYL 4-0 PS2 18IN ABS (SUTURE) ×3 IMPLANT
SYR BULB 3OZ (MISCELLANEOUS) ×3 IMPLANT
SYR CONTROL 10ML LL (SYRINGE) ×3 IMPLANT
TOWEL OR 17X24 6PK STRL BLUE (TOWEL DISPOSABLE) ×3 IMPLANT
TUBE CONNECTING 20'X1/4 (TUBING) ×1
TUBE CONNECTING 20X1/4 (TUBING) ×2 IMPLANT
UNDERPAD 30X30 INCONTINENT (UNDERPADS AND DIAPERS) ×3 IMPLANT

## 2013-09-05 NOTE — Brief Op Note (Signed)
09/05/2013  8:46 AM  PATIENT:  Trish MageMae N Kasel  58 y.o. female  PRE-OPERATIVE DIAGNOSIS:  RIGHT DISTAL RADIUS FRACTURE  POST-OPERATIVE DIAGNOSIS:  * No post-op diagnosis entered *  PROCEDURE:  Procedure(s): RIGHT OPEN REDUCTION INTERNAL FIXATION (ORIF) DISTAL RADIUS FRACTURE/REPAIR AS INDICATED (Right)  SURGEON:  Surgeon(s) and Role:    * Sharma CovertFred W Georgenia Salim, MD - Primary  PHYSICIAN ASSISTANT:   ASSISTANTS: none   ANESTHESIA:   general  EBL:     BLOOD ADMINISTERED:none  DRAINS: none   LOCAL MEDICATIONS USED:  MARCAINE     SPECIMEN:  No Specimen  DISPOSITION OF SPECIMEN:  NONE  COUNTS:  YES  TOURNIQUET:    DICTATION: .typed  PLAN OF CARE: Admit for overnight observation  PATIENT DISPOSITION:  PACU - hemodynamically stable.   Delay start of Pharmacological VTE agent (>24hrs) due to surgical blood loss or risk of bleeding: not applicable

## 2013-09-05 NOTE — Anesthesia Preprocedure Evaluation (Addendum)
Anesthesia Evaluation  Patient identified by MRN, date of birth, ID band Patient awake    Reviewed: Allergy & Precautions, H&P , NPO status , Patient's Chart, lab work & pertinent test results  History of Anesthesia Complications (+) PONV  Airway Mallampati: III TM Distance: >3 FB Neck ROM: Full    Dental  (+) Teeth Intact, Caps, Dental Advisory Given   Pulmonary neg pulmonary ROS,  breath sounds clear to auscultation  Pulmonary exam normal       Cardiovascular hypertension, Pt. on medications and Pt. on home beta blockers Rate:Normal     Neuro/Psych  Headaches, negative psych ROS   GI/Hepatic negative GI ROS, Neg liver ROS,   Endo/Other  negative endocrine ROS  Renal/GU negative Renal ROS  negative genitourinary   Musculoskeletal negative musculoskeletal ROS (+)   Abdominal   Peds  Hematology negative hematology ROS (+)   Anesthesia Other Findings   Reproductive/Obstetrics                        Anesthesia Physical Anesthesia Plan  ASA: II  Anesthesia Plan: General   Post-op Pain Management:    Induction: Intravenous  Airway Management Planned: Oral ETT  Additional Equipment:   Intra-op Plan:   Post-operative Plan: Extubation in OR  Informed Consent: I have reviewed the patients History and Physical, chart, labs and discussed the procedure including the risks, benefits and alternatives for the proposed anesthesia with the patient or authorized representative who has indicated his/her understanding and acceptance.     Plan Discussed with: CRNA  Anesthesia Plan Comments:        Anesthesia Quick Evaluation

## 2013-09-05 NOTE — Anesthesia Postprocedure Evaluation (Signed)
Anesthesia Post Note  Patient: Morgan Gross  Procedure(s) Performed: Procedure(s) (LRB): RIGHT OPEN REDUCTION INTERNAL FIXATION (ORIF) DISTAL RADIUS FRACTURE/REPAIR AS INDICATED (Right)  Anesthesia type: General  Patient location: PACU  Post pain: Pain level controlled  Post assessment: Post-op Vital signs reviewed  Last Vitals:  Filed Vitals:   09/05/13 1200  BP: 130/74  Pulse: 73  Temp:   Resp: 14    Post vital signs: Reviewed  Level of consciousness: sedated  Complications: No apparent anesthesia complications

## 2013-09-05 NOTE — H&P (Signed)
Morgan Gross is an 58 y.o. female.   Chief Complaint: right wrist pain HPI: PT SEEN/EVALUATED IN OFFICE PT FELL ON Friday SUSTAINING CLOSED DISTAL RADIUS FRACTURE PT HERE FOR SURGERY ON RIGHT WRIST NO PRIOR SURGERY TO RIGHT WRIST PT RHD  Past Medical History  Diagnosis Date  . Hypertension   . Right radial fracture     DISTAL --  FELL ON ICE  09-02-2013  . [id number](272.5Headache(784.0)     Past Surgical History  Procedure Laterality Date  . Abdominal hysterectomy      History reviewed. No pertinent family history. Social History:  reports that she has never smoked. She does not have any smokeless tobacco history on file. She reports that she does not drink alcohol or use illicit drugs.  Allergies:  Allergies  Allergen Reactions  . Vicodin [Hydrocodone-Acetaminophen] Nausea And Vomiting    Medications Prior to Admission  Medication Sig Dispense Refill  . cholecalciferol (VITAMIN D) 1000 UNITS tablet Take 1,000 Units by mouth 2 (two) times daily.      . metoprolol (TOPROL-XL) 50 MG 24 hr tablet Take 50 mg by mouth daily.        Marland Kitchen. triamterene-hydrochlorothiazide (MAXZIDE-25) 37.5-25 MG per tablet Take 1 tablet by mouth daily.          Results for orders placed during the hospital encounter of 09/05/13 (from the past 48 hour(s))  POCT I-STAT 4, (NA,K, GLUC, HGB,HCT)     Status: Abnormal   Collection Time    09/05/13  8:01 AM      Result Value Ref Range   Sodium 143  137 - 147 mEq/L   Potassium 3.5 (*) 3.7 - 5.3 mEq/L   Glucose, Bld 107 (*) 70 - 99 mg/dL   HCT 36.646.0  44.036.0 - 34.746.0 %   Hemoglobin 15.6 (*) 12.0 - 15.0 g/dL   No results found.  ROS  NO RECENT ILLNESSES OR HOSPITALIZATIONS   Blood pressure 145/83, pulse 74, temperature 98 F (36.7 C), temperature source Oral, resp. rate 16, weight 53.978 kg (119 lb), SpO2 96.00%. Physical Exam  General Appearance:  Alert, cooperative, no distress, appears stated age  Head:  Normocephalic, without obvious abnormality, atraumatic  Eyes:   Pupils equal, conjunctiva/corneas clear,         Throat: Lips, mucosa, and tongue normal; teeth and gums normal  Neck: No visible masses     Lungs:   respirations unlabored  Chest Wall:  No tenderness or deformity  Heart:  Regular rate and rhythm,  Abdomen:   Soft, non-tender,         Extremities: RIGHT WRIST/UE: IN SHORT ARM VOLAR SPLINT FINGERS WARM WELL PERFUSED GOOD DIGITAL MOTION BUT NOT FULL ABLE TO EXTEND THUMB AND WIGGLE FINGERS  Pulses: 2+ and symmetric  Skin: Skin color, texture, turgor normal, no rashes or lesions     Neurologic: Normal    Assessment/Plan RIGHT WRIST COMMINUTED DISTAL RADIUS INTRAARTICULAR FRACTURE, DISPLACED  RIGHT WRIST OPEN REDUCTION AND INTERNAL FIXATION AND REPAIR AS INDICATED  R/B/A DISCUSSED WITH PT IN OFFICE.  PT VOICED UNDERSTANDING OF PLAN CONSENT SIGNED DAY OF SURGERY PT SEEN AND EXAMINED PRIOR TO OPERATIVE PROCEDURE/DAY OF SURGERY SITE MARKED. QUESTIONS ANSWERED WILL REMAIN OBSERVATION FOLLOWING SURGERY  Sharma CovertORTMANN,Vena Bassinger W 09/05/2013, 8:44 AM

## 2013-09-05 NOTE — Anesthesia Procedure Notes (Signed)
**Note De-Gross via Obfuscation** Procedure Name: LMA Insertion Date/Time: 09/05/2013 9:20 AM Performed by: Jessica PriestBEESON, Morgan Hinshaw C Pre-anesthesia Checklist: Patient Gross, Emergency Drugs available, Suction available and Patient being monitored Patient Re-evaluated:Patient Re-evaluated prior to inductionOxygen Delivery Method: Circle System Utilized Preoxygenation: Pre-oxygenation with 100% oxygen Intubation Type: IV induction Ventilation: Mask ventilation without difficulty LMA: LMA inserted LMA Size: 4.0 Number of attempts: 1 Airway Equipment and Method: bite block Placement Confirmation: positive ETCO2 Tube secured with: Tape Dental Injury: Teeth and Oropharynx as per pre-operative assessment

## 2013-09-05 NOTE — Discharge Instructions (Signed)
KEEP BANDAGE CLEAN AND DRY °CALL OFFICE FOR F/U APPT [phone] °DR Micaylah Bertucci CELL PHONE [phone] °KEEP HAND ELEVATED ABOVE HEART °OK TO APPLY ICE TO OPERATIVE AREA °CONTACT OFFICE IF ANY WORSENING PAIN OR CONCERNS. °

## 2013-09-05 NOTE — Op Note (Signed)
OPERATIVE REPORT   POSTOPERATIVE DIAGNOSIS: Right wrist comminuted intra-articular distal  radius fracture of 2 or more fragments.   POSTOPERATIVE DIAGNOSIS: Right wrist comminuted intra-articular distal  radius fracture of 2 or more fragments.   ATTENDING PHYSICIAN: Dr. Sharma CovertFred W Benedetto Ryder IV who scrubbed and present for  the entire procedure.   ASSISTANT SURGEON: None.   ANESTHESIA: GENERAL VIA LMA   TOURNIQUET TIME: Less than 1 hour at 250 mmHg.   SURGICAL PROCEDURES:  1. Right wrist open reduction and internal fixation of comminuted intraarticular fracture of 2 or more fragments distal radius fracture.  2. Right wrist brachioradialis tendon release, tendon tenotomy.  3. Right wrist radiograph 3 views, right wrist.   SURGICAL IMPLANTS: Biomet Crosslock narrow plate with distal locking pegs  in combination of locking and nonlocking screws proximally.  One multidirectional screw  SURGICAL INDICATIONS: Morgan Gross is a 58 year old female who  sustained a comminuted intr-articular distal radius fracture,  displaced and angulated. Patient was seen and evaluated in the office and  based on his high degree of comminution recommended undergo the above  procedure. Risks, benefits, and alternatives were discussed in detail  with patient and signed informed consent was obtained. Risks include,  but not limited to bleeding, infection, damage to nearby nerves,  arteries, or tendons, loss of motion of wrist and digits, incomplete  relief of symptoms, nonunion, malunion, hardware failure, and need for  further surgical intervention.   DESCRIPTION OF PROCEDURE: Patient was properly identified in the  preoperative holding area and marked with a permanent marker made on  right wrist to indicate the correct operative site. Patient was then  brought back to the operating room, placed supine on the anesthesia  table. General anesthesia was administered. Patient tolerated this  well. A well-padded  tourniquet was then placed on right brachium and  sealed with 1000 drape. Right upper extremity was then prepped and  draped in normal sterile fashion. A time-out was called, correct side  was identified, procedure was then begun. Attention was then turned to  the left wrist. A longitudinal was made directly over the FCR sheath.  Limb was then elevated and tourniquet insufflated. Patient received  preoperative antibiotics prior to skin incision. Time-out was called,  correct side was identified, and procedure was then begun. Several cm  incision was made directly over the FCR sheath. Dissection was carried  down through the skin and subcutaneous tissue. The FCR sheath was then  opened. Going through the floor of the FCR sheath, the FPL was then  carefully identified and the pronator quadratus was then identified and  opened in an L-shaped fashion. This exposed the comminuted intra  Articular fracture of 2 or more fragments.  Following this,the brachioradialis was then carefully released and first protection of  first dorsal compartment tendons was done and exposed the   comminuted radial column.   An open reduction was then performed and then  once this was carefully done, the volar plate was then applied. Given  the size of the radius, a narrow plate was then chosen. This confirmed  using the mini C-arm. Following this, the plate was then applied on the  volar surface after the open reduction of the intra-articular fragments  were then carried out, and then held in place with a K-wire. The oblong  screw hole was then placed. Plate height was adjusted. Following this,  proximal fixation was then carried out from an ulnar to radial direction  with locking pegs.  Following this, radiographs were obtained which  showed good reduction and good placement of the subchondral bone. The  distal fixation was then carried out. The distal locking pegs and one multidirectional screw.   Final  fixation was then carried out proximally with a combination of locking  and nonlocking screws within the radial shaft. Wound was then  thoroughly irrigated. Copious wound irrigation was done. Pronator  quadratus was then closed with 2-0 Vicryl. Subcutaneous tissues closed  with 4-0 Vicryl and the skin was closed with simple 4-0 Prolene. A 10  mL 0.25% Marcaine infiltrated locally. Adaptic dressing, sterile  compressive bandage was then applied. Stress examination of distal  radioulnar joint, SL interval, and LT interval does not show any enter  of ligaments or instability of the wrist. After the sugar-tong splint  was then applied, patient was explained and taken to recovery room in  good condition.   Radiographs: AP/LATERAL AND OBLIQUE, INTRAOP INTERPRETATION 3 view of wrist show internal fixation in place in good position with plate in place   Post op Plan: Admission for iv antibiotics  Pain control  F/u in office in 2 weeks for xrays out of splint and then short arm cast  Short arm cast for 2 weeks and then xrays out of cast and then begin OT.

## 2013-09-05 NOTE — Transfer of Care (Signed)
Immediate Anesthesia Transfer of Care Note  Patient: Morgan Gross  Procedure(s) Performed: Procedure(s) (LRB): RIGHT OPEN REDUCTION INTERNAL FIXATION (ORIF) DISTAL RADIUS FRACTURE/REPAIR AS INDICATED (Right)  Patient Location: PACU  Anesthesia Type: General  Level of Consciousness: awake, sedated, patient cooperative and responds to stimulation  Airway & Oxygen Therapy: Patient Spontanous Breathing and Patient connected to face mask oxygen  Post-op Assessment: Report given to PACU RN, Post -op Vital signs reviewed and stable and Patient moving all extremities  Post vital signs: Reviewed and stable  Complications: No apparent anesthesia complications

## 2013-09-06 ENCOUNTER — Encounter (HOSPITAL_BASED_OUTPATIENT_CLINIC_OR_DEPARTMENT_OTHER): Payer: Self-pay | Admitting: Orthopedic Surgery

## 2013-09-06 NOTE — Discharge Summary (Signed)
Physician Discharge Summary  Patient ID: Morgan Gross MRN: [id number] DOB/AGE: Jul 16, 1955 58 y.o.  Admit date: 09/05/2013 Discharge date: 09/06/2013  Admission Diagnoses: RIGHT DISTAL RADIUS FRACTURE Past Medical History  Diagnosis Date  . Hypertension   . Right radial fracture     DISTAL --  FELL ON ICE  09-02-2013  . [id number](324.4)     Discharge Diagnoses:  Active Problems:   Distal radius fracture, right   Surgeries: Procedure(s): RIGHT OPEN REDUCTION INTERNAL FIXATION (ORIF) DISTAL RADIUS FRACTURE/REPAIR AS INDICATED on 09/05/2013    Consultants:    Discharged Condition: Improved  Hospital Course: Morgan Gross is an 58 y.o. female who was admitted 09/05/2013 with a chief complaint of No chief complaint on file. , and found to have a diagnosis of RIGHT DISTAL RADIUS FRACTURE.  They were brought to the operating room on 09/05/2013 and underwent Procedure(s): RIGHT OPEN REDUCTION INTERNAL FIXATION (ORIF) DISTAL RADIUS FRACTURE/REPAIR AS INDICATED.    They were given perioperative antibiotics: Anti-infectives   Start     Dose/Rate Route Frequency Ordered Stop   09/05/13 2200  ceFAZolin (ANCEF) IVPB 1 g/50 mL premix     1 g 100 mL/hr over 30 Minutes Intravenous 3 times per day 09/05/13 1244     09/05/13 1300  ceFAZolin (ANCEF) IVPB 1 g/50 mL premix     1 g 100 mL/hr over 30 Minutes Intravenous NOW 09/05/13 1244 09/05/13 1414   09/05/13 0717  ceFAZolin (ANCEF) IVPB 2 g/50 mL premix  Status:  Discontinued     2 g 100 mL/hr over 30 Minutes Intravenous On call to O.R. 09/05/13 0102 09/05/13 1243    .  They were given sequential compression devices, early ambulation, and Other (comment)abmulation for DVT prophylaxis.  Recent vital signs: Patient Vitals for the past 24 hrs:  BP Temp Temp src Pulse Resp SpO2 Height Weight  09/06/13 0715 148/84 mmHg 99.2 F (37.3 C) Oral 91 16 96 % - -  09/06/13 0213 107/67 mmHg 97.5 F (36.4 C) Oral 82 16 98 % - -  09/05/13 2311 137/69 mmHg 98.1  F (36.7 C) Oral 88 16 97 % - -  09/05/13 1756 121/77 mmHg 97.8 F (36.6 C) Oral 89 16 98 % - -  09/05/13 1543 125/75 mmHg 97.6 F (36.4 C) Oral 74 16 97 % - -  09/05/13 1441 118/73 mmHg 97.7 F (36.5 C) Oral 86 16 96 % - -  09/05/13 1346 113/82 mmHg 98.2 F (36.8 C) Oral 79 - 95 % - -  09/05/13 1244 151/81 mmHg 98.1 F (36.7 C) Oral 73 - 95 % 5' (1.524 m) 53.9 kg (118 lb 13.3 oz)  .  Recent laboratory studies: No results found.  Discharge Medications:     Medication List         cholecalciferol 1000 UNITS tablet  Commonly known as:  VITAMIN D  Take 1,000 Units by mouth 2 (two) times daily.     docusate sodium 100 MG capsule  Commonly known as:  COLACE  Take 1 capsule (100 mg total) by mouth 2 (two) times daily.     methocarbamol 500 MG tablet  Commonly known as:  ROBAXIN  Take 1 tablet (500 mg total) by mouth 4 (four) times daily.     metoprolol succinate 50 MG 24 hr tablet  Commonly known as:  TOPROL-XL  Take 50 mg by mouth daily.     ondansetron 4 MG tablet  Commonly known as:  ZOFRAN  Take 1 tablet (  4 mg total) by mouth every 8 (eight) hours as needed for nausea or vomiting.     oxyCODONE-acetaminophen 5-325 MG per tablet  Commonly known as:  ROXICET  Take 1 tablet by mouth every 4 (four) hours as needed for severe pain.     triamterene-hydrochlorothiazide 37.5-25 MG per tablet  Commonly known as:  MAXZIDE-25  Take 1 tablet by mouth daily.     vitamin C 500 MG tablet  Commonly known as:  ASCORBIC ACID  Take 1 tablet (500 mg total) by mouth daily.        Diagnostic Studies: Dg Wrist Complete Right  09/02/2013   CLINICAL DATA:  58 year old female status post fall with pain and swelling. Initial encounter.  EXAM: RIGHT WRIST - COMPLETE 3+ VIEW  COMPARISON:  None.  FINDINGS: Impacted fracture of the distal right radius meta diaphysis is mildly comminuted. There is mild to moderate dorsal angulation. There does appear to be radiocarpal joint involvement on the  lateral view. DRU involvement. Distal ulna intact with tiny chronic appearing fragment adjacent to the ulnar styloid. Carpal bone alignment preserved. Scaphoid intact. Metacarpals intact.  IMPRESSION: Mildly comminuted, impacted, and dorsally angulated distal right radius fracture.   Electronically Signed   By: Augusto GambleLee  Hall M.D.   On: 09/02/2013 08:52   Ct Head Wo Contrast  09/02/2013   CLINICAL DATA:  Recent traumatic injury with headaches  EXAM: CT HEAD WITHOUT CONTRAST  TECHNIQUE: Contiguous axial images were obtained from the base of the skull through the vertex without intravenous contrast.  COMPARISON:  None.  FINDINGS: The bony calvarium is intact. The ventricles are of normal size and configuration. No findings to suggest acute hemorrhage, acute infarction or space-occupying mass lesion noted.  IMPRESSION: No acute abnormality seen.   Electronically Signed   By: Alcide CleverMark  Lukens M.D.   On: 09/02/2013 08:58    They benefited maximally from their hospital stay and there were no complications.     Disposition: 01-Home or Self Care     Discharge Orders   Future Orders Complete By Expires   Discharge patient  As directed      Follow-up Information   Follow up with Sharma CovertTMANN,Lawarence Meek W, MD.   Specialty:  Orthopedic Surgery   Contact information:   [address] [place] [address] [phone]        Signed: Sharma CovertORTMANN,Dayona Shaheen W 09/06/2013, 12:34 PM

## 2013-09-06 NOTE — Progress Notes (Signed)
   CARE MANAGEMENT NOTE 09/06/2013  Patient:  Morgan Gross,[name]   Account Number:  [id number]  Date Initiated:  09/06/2013  Documentation initiated by:  [facility]Esther [facility]  Subjective/Objective Assessment:   RIGHT DISTAL RADIUS FRACTURE     Action/Plan:   home with family   Anticipated DC Date:  09/06/2013   Anticipated DC Plan:  HOME/SELF CARE      DC Planning Services  CM consult      Choice offered to / List presented to:             Status of service:  Completed, signed off Medicare Important Message given?   (If response is "NO", the following Medicare IM given date fields will be blank) Date Medicare IM given:   Date Additional Medicare IM given:    Discharge Disposition:  HOME/SELF CARE  Per UR Regulation:    If discussed at Long Length of Stay Meetings, dates discussed:    Comments:  No NCM needs identified. Daryl EasternAlesia Shaivs RN CCM Case Mgmt phone [phone]

## 2014-01-20 ENCOUNTER — Other Ambulatory Visit (HOSPITAL_COMMUNITY): Payer: Self-pay | Admitting: Internal Medicine

## 2014-01-20 DIAGNOSIS — Z1231 Encounter for screening mammogram for malignant neoplasm of breast: Secondary | ICD-10-CM

## 2014-02-21 ENCOUNTER — Ambulatory Visit (HOSPITAL_COMMUNITY): Payer: 59 | Attending: Internal Medicine

## 2014-03-02 ENCOUNTER — Ambulatory Visit (HOSPITAL_COMMUNITY)
Admission: RE | Admit: 2014-03-02 | Discharge: 2014-03-02 | Disposition: A | Discharge status: Home / Self Care | Payer: 59 | Source: Ambulatory Visit | Attending: Internal Medicine | Admitting: Internal Medicine

## 2014-03-02 DIAGNOSIS — Z1231 Encounter for screening mammogram for malignant neoplasm of breast: Secondary | ICD-10-CM | POA: Insufficient documentation

## 2014-03-16 IMAGING — CR DG WRIST COMPLETE 3+V*R*
3 series · 3 of 3 positions shown · non-contrast
Comparison: None.

CLINICAL DATA: 57-year-old female status post fall with pain and
swelling. Initial encounter.

EXAM:
RIGHT WRIST - COMPLETE 3+ VIEW

[x wrist pa right]
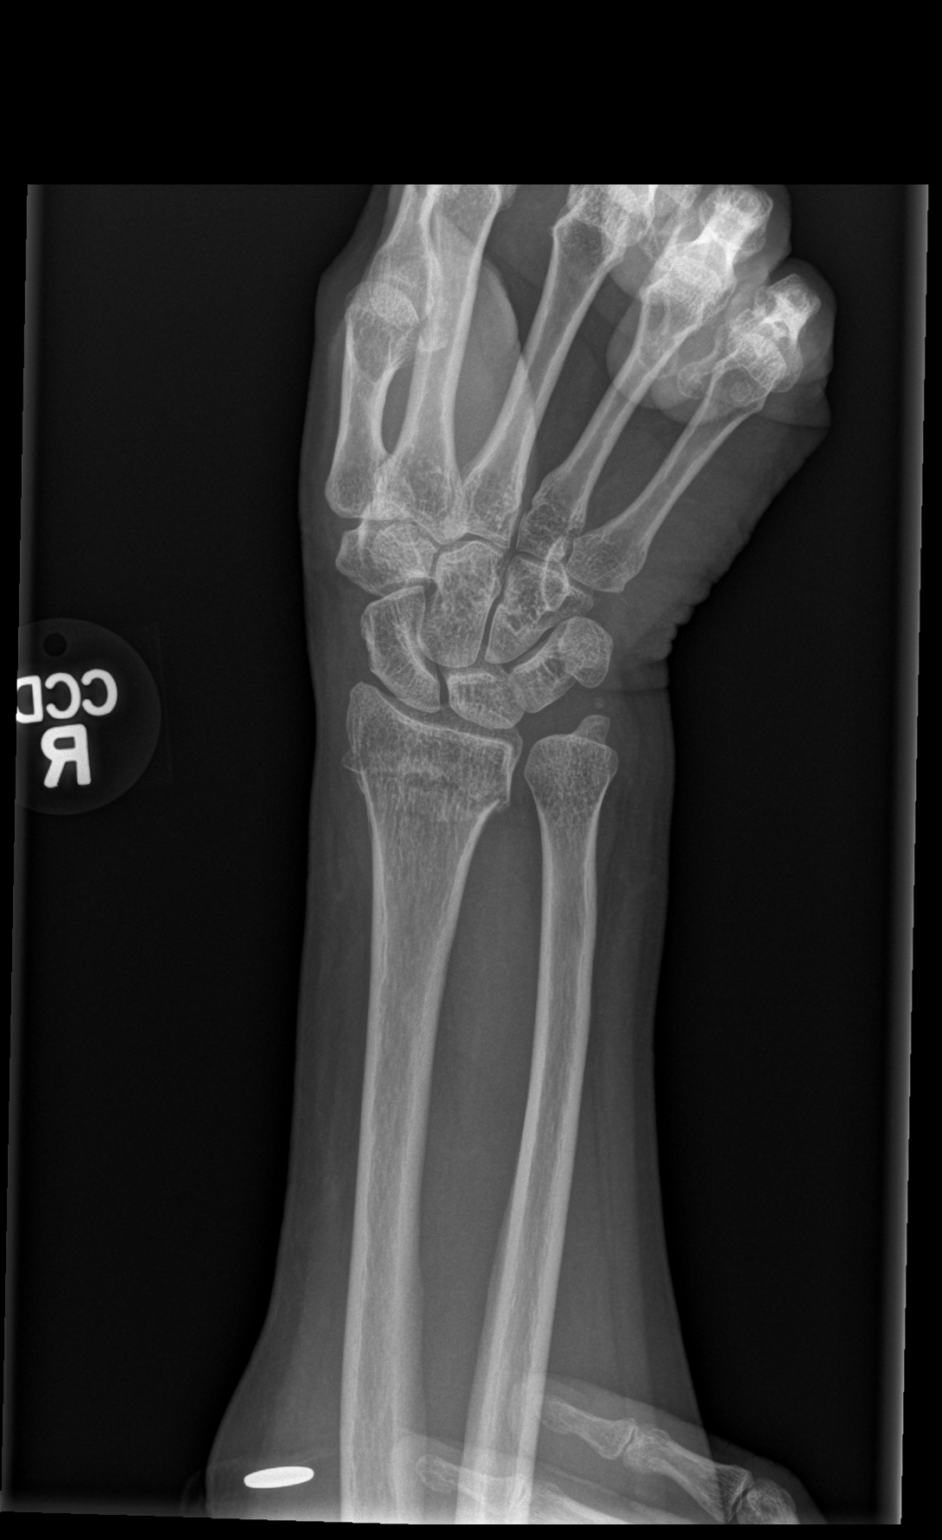

[x wrist obl right]
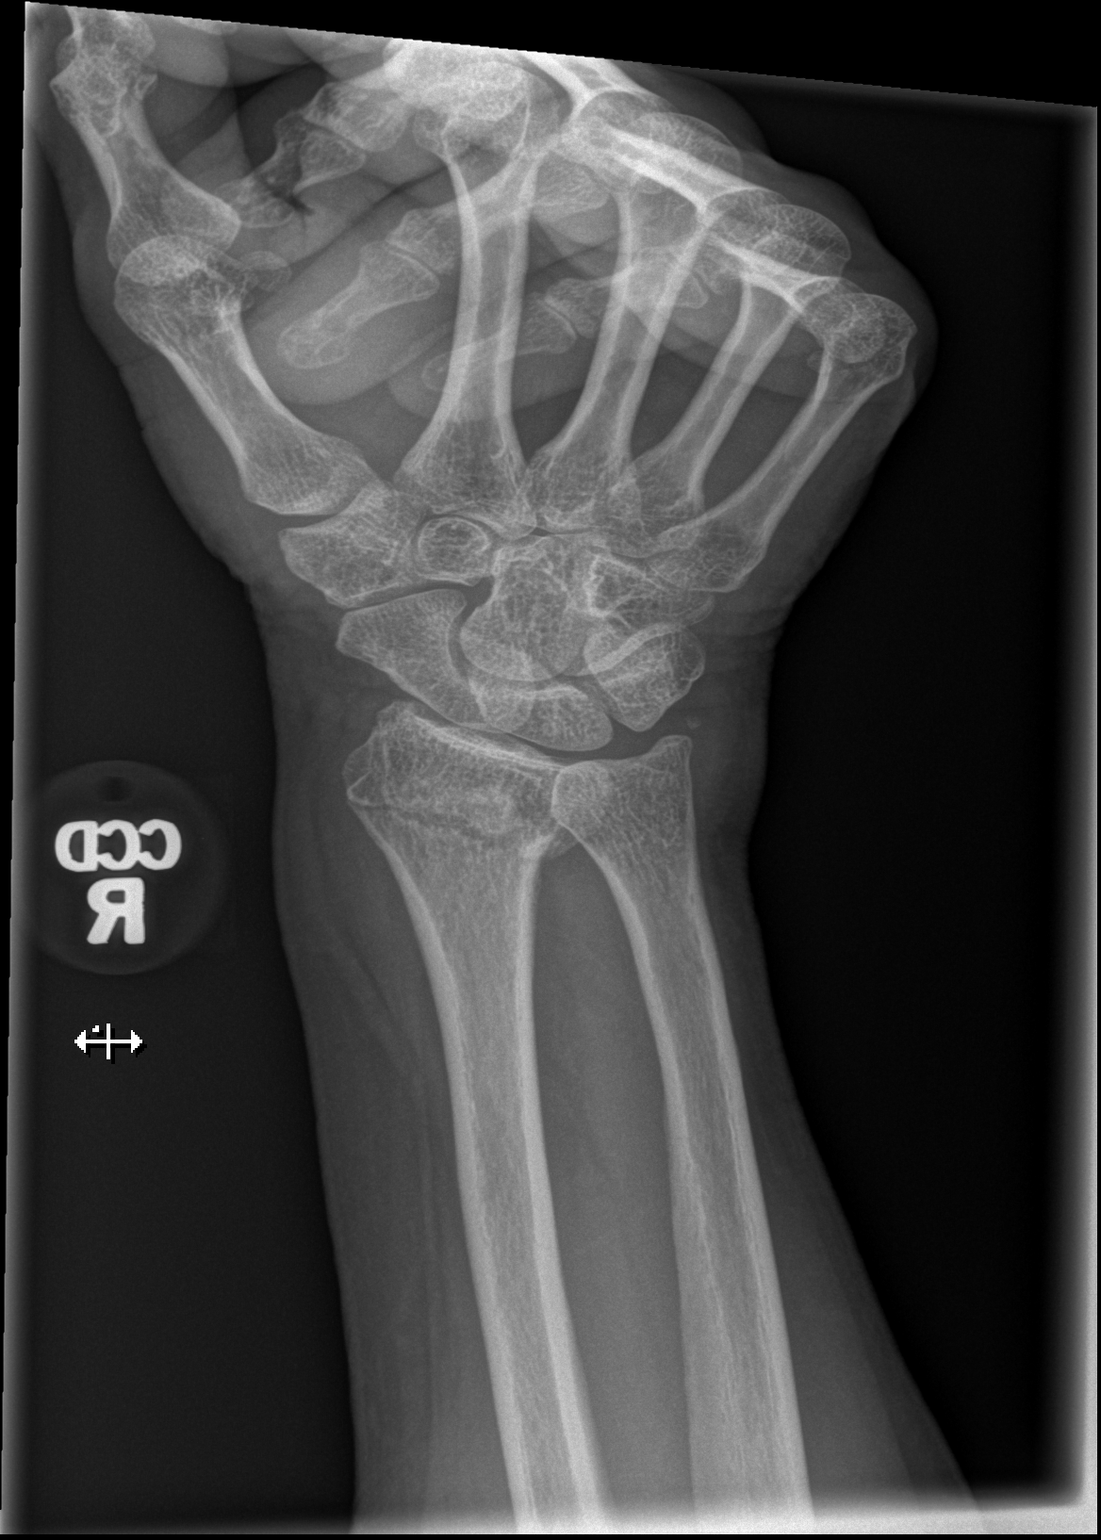

[x wrist lat right]
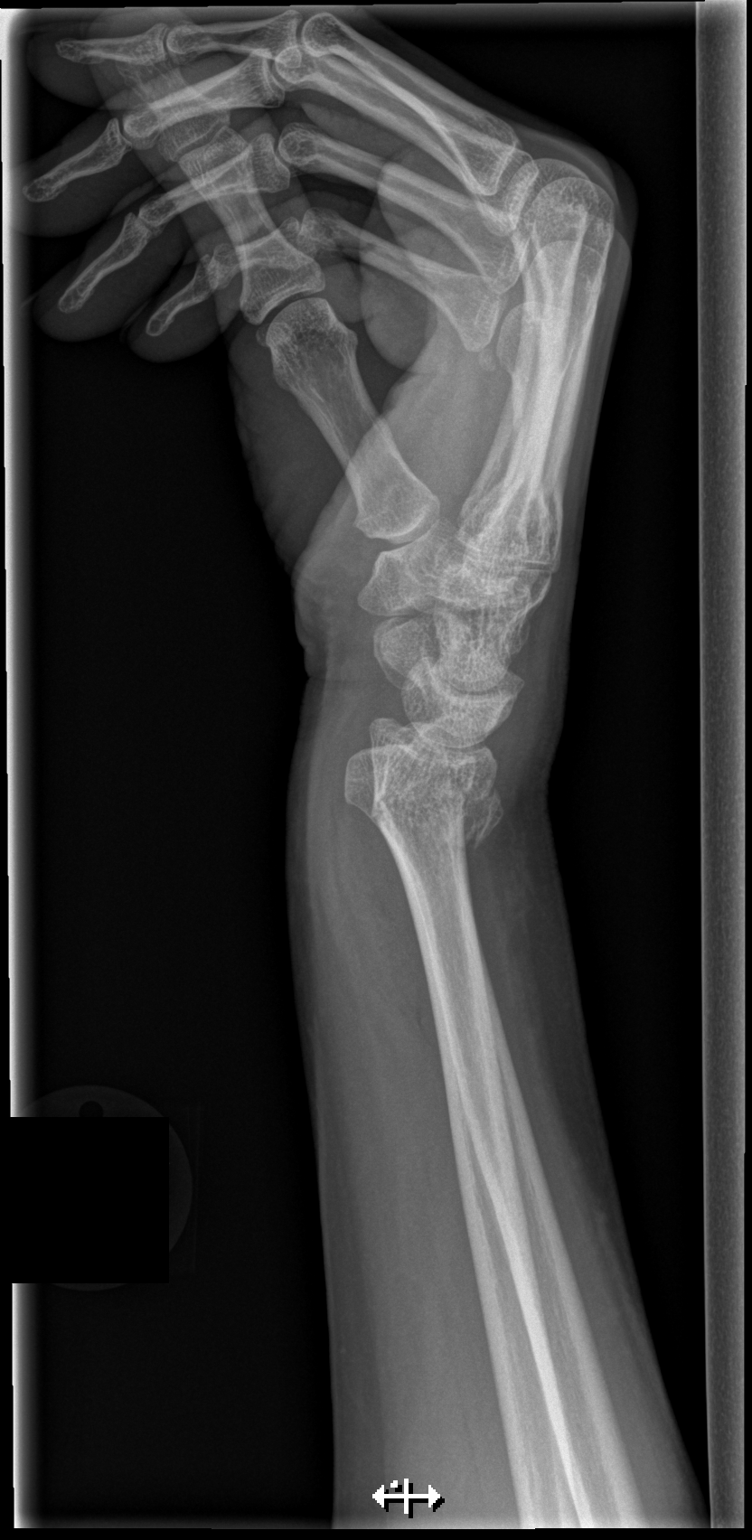

[3 of 3 positions shown; findings below may reference images not displayed]

FINDINGS: Impacted fracture of the distal right radius meta diaphysis is
mildly comminuted. There is mild to moderate dorsal angulation.
There does appear to be radiocarpal joint involvement on the lateral
view. YEH involvement. Distal ulna intact with tiny chronic
appearing fragment adjacent to the ulnar styloid. Carpal bone
alignment preserved. Scaphoid intact. Metacarpals intact.
IMPRESSION: Mildly comminuted, impacted, and dorsally angulated distal right
radius fracture.

## 2015-11-02 ENCOUNTER — Other Ambulatory Visit: Payer: Self-pay

## 2015-11-02 DIAGNOSIS — Z1231 Encounter for screening mammogram for malignant neoplasm of breast: Secondary | ICD-10-CM

## 2015-11-30 ENCOUNTER — Ambulatory Visit
Admission: RE | Admit: 2015-11-30 | Discharge: 2015-11-30 | Disposition: A | Discharge status: Home / Self Care | Payer: PRIVATE HEALTH INSURANCE | Source: Ambulatory Visit

## 2015-11-30 DIAGNOSIS — Z1231 Encounter for screening mammogram for malignant neoplasm of breast: Secondary | ICD-10-CM

## 2016-08-03 ENCOUNTER — Encounter (HOSPITAL_COMMUNITY): Payer: Self-pay | Admitting: Emergency Medicine

## 2016-08-03 ENCOUNTER — Emergency Department (HOSPITAL_COMMUNITY)
Admission: EM | Admit: 2016-08-03 | Discharge: 2016-08-03 | Disposition: A | Discharge status: Home / Self Care | Payer: BLUE CROSS/BLUE SHIELD | Attending: Emergency Medicine | Admitting: Emergency Medicine

## 2016-08-03 DIAGNOSIS — E876 Hypokalemia: Secondary | ICD-10-CM | POA: Insufficient documentation

## 2016-08-03 DIAGNOSIS — R42 Dizziness and giddiness: Secondary | ICD-10-CM | POA: Diagnosis present

## 2016-08-03 DIAGNOSIS — Z79899 Other long term (current) drug therapy: Secondary | ICD-10-CM | POA: Diagnosis not present

## 2016-08-03 DIAGNOSIS — I10 Essential (primary) hypertension: Secondary | ICD-10-CM | POA: Diagnosis not present

## 2016-08-03 LAB — BASIC METABOLIC PANEL
ANION GAP: 8 (ref 5–15)
BUN: 14 mg/dL (ref 6–20)
CHLORIDE: 104 mmol/L (ref 101–111)
CO2: 27 mmol/L (ref 22–32)
Calcium: 9.1 mg/dL (ref 8.9–10.3)
Creatinine, Ser: 0.75 mg/dL (ref 0.44–1.00)
GFR calc non Af Amer: >60 mL/min (ref >60)
Glucose, Bld: 94 mg/dL (ref 65–99)
POTASSIUM: 3.1 mmol/L — AB (ref 3.5–5.1)
Sodium: 139 mmol/L (ref 135–145)

## 2016-08-03 LAB — CBC WITH DIFFERENTIAL/PLATELET
BASOS ABS: 0.1 10*3/uL (ref 0.0–0.1)
Basophils Relative: 1 %
Eosinophils Absolute: 0.2 10*3/uL (ref 0.0–0.7)
Eosinophils Relative: 3 %
HEMATOCRIT: 41.4 % (ref 36.0–46.0)
HEMOGLOBIN: 14.5 g/dL (ref 12.0–15.0)
LYMPHS PCT: 40 %
Lymphs Abs: 2 10*3/uL (ref 0.7–4.0)
MCH: 29.8 pg (ref 26.0–34.0)
MCHC: 35 g/dL (ref 30.0–36.0)
MCV: 85 fL (ref 78.0–100.0)
MONOS PCT: 8 %
Monocytes Absolute: 0.4 10*3/uL (ref 0.1–1.0)
NEUTROS ABS: 2.3 10*3/uL (ref 1.7–7.7)
Neutrophils Relative %: 48 %
Platelets: ADEQUATE 10*3/uL (ref 150–400)
RBC: 4.87 MIL/uL (ref 3.87–5.11)
RDW: 12.6 % (ref 11.5–15.5)
WBC: 5 10*3/uL (ref 4.0–10.5)

## 2016-08-03 MED ORDER — AMLODIPINE BESYLATE 5 MG PO TABS
5.0000 mg | ORAL_TABLET | Freq: Once | ORAL | Status: AC
  Administered 2016-08-03: 5 mg via ORAL
  Filled 2016-08-03: qty 1

## 2016-08-03 MED ORDER — POTASSIUM CHLORIDE CRYS ER 20 MEQ PO TBCR
40.0000 meq | EXTENDED_RELEASE_TABLET | Freq: Once | ORAL | Status: AC
  Administered 2016-08-03: 40 meq via ORAL
  Filled 2016-08-03: qty 2

## 2016-08-03 MED ORDER — TRIAMTERENE-HCTZ 75-50 MG PO TABS: 1.0000 | ORAL_TABLET | Freq: Every day | ORAL | 1 refills | Status: AC

## 2016-08-03 NOTE — Discharge Instructions (Signed)
Increase your Maxzide dose to the new prescription. Follow-up with your primary care doctor this week to check on her blood pressure. If he starts noticing that her blood pressure is running too low for years able to see her primary doctor  go back to your previous dosing.

## 2016-08-03 NOTE — ED Triage Notes (Addendum)
Patient c/o headache, blurred vision and hypertension since this morning.  Patient states it was 144/110.  Patient takes BP medications and states she took them this am.

## 2016-08-03 NOTE — ED Provider Notes (Signed)
[facility]-EMERGENCY DEPT Provider Note   CSN: [id number] Arrival date & time: 08/03/16  1543     History   Chief Complaint Chief Complaint  Patient presents with  . Headache  . Blurred Vision  . Hypertension    HPI Morgan Gross is a 61 y.o. female.  HPI Pt felt lightheaded today.  She did not feel that the room was spinning but rather a pressure in her head.  No numbness, or weakness.  No trouble with balance coordination or speech.  She checked her blood pressure today and it was elevated in the 170s.   She took her medication and rested but when she checked it again it was 180.  She decided to come to the ED.   She recently had a GI bug and had some vomiting and diarrhea.  She had not been able to take her meds well while sick but she did take them today. Past Medical History:  Diagnosis Date  . Headache(784.0)   . Hypertension   . Right radial fracture    DISTAL --  FELL ON ICE  09-02-2013    Patient Active Problem List   Diagnosis Date Noted  . Distal radius fracture, right 09/05/2013    Past Surgical History:  Procedure Laterality Date  . ABDOMINAL HYSTERECTOMY    . OPEN REDUCTION INTERNAL FIXATION (ORIF) DISTAL RADIAL FRACTURE Right 09/05/2013   Procedure: RIGHT OPEN REDUCTION INTERNAL FIXATION (ORIF) DISTAL RADIUS FRACTURE/REPAIR AS INDICATED;  Surgeon: Sharma CovertFred W Ortmann, MD;  Location: [facility];  Service: Orthopedics;  Laterality: Right;    OB History    No data available       Home Medications    Prior to Admission medications   Medication Sig Start Date End Date Taking? Authorizing Provider  cholecalciferol (VITAMIN D) 1000 UNITS tablet Take 1,000 Units by mouth 2 (two) times daily.    Historical Provider, MD  docusate sodium (COLACE) 100 MG capsule Take 1 capsule (100 mg total) by mouth 2 (two) times daily. 09/05/13   Bradly BienenstockFred Ortmann, MD  methocarbamol (ROBAXIN) 500 MG tablet Take 1 tablet (500 mg total) by mouth 4 (four) times daily. 09/05/13   Bradly BienenstockFred  Ortmann, MD  metoprolol (TOPROL-XL) 50 MG 24 hr tablet Take 50 mg by mouth daily.      Historical Provider, MD  ondansetron (ZOFRAN) 4 MG tablet Take 1 tablet (4 mg total) by mouth every 8 (eight) hours as needed for nausea or vomiting. 09/05/13   Bradly BienenstockFred Ortmann, MD  oxyCODONE-acetaminophen (ROXICET) 5-325 MG per tablet Take 1 tablet by mouth every 4 (four) hours as needed for severe pain. 09/05/13   Bradly BienenstockFred Ortmann, MD  triamterene-hydrochlorothiazide (MAXZIDE) 75-50 MG tablet Take 1 tablet by mouth daily. 08/03/16   Linwood DibblesJon Imoni Kohen, MD  vitamin C (ASCORBIC ACID) 500 MG tablet Take 1 tablet (500 mg total) by mouth daily. 09/05/13   Bradly BienenstockFred Ortmann, MD    Family History No family history on file.  Social History Social History  Substance Use Topics  . Smoking status: Never Smoker  . Smokeless tobacco: Never Used  . Alcohol use No     Allergies   Vicodin [hydrocodone-acetaminophen]   Review of Systems Review of Systems  All other systems reviewed and are negative.    Physical Exam Updated Vital Signs BP 139/92   Pulse (!) 59   Temp 97.4 F (36.3 C) (Oral)   Resp 17   Ht 5' (1.524 m)   Wt 54.4 kg   SpO2  100%   BMI 23.44 kg/m   Physical Exam  Constitutional: She is oriented to person, place, and time. She appears well-developed and well-nourished. No distress.  HENT:  Head: Normocephalic and atraumatic.  Right Ear: External ear normal.  Left Ear: External ear normal.  Mouth/Throat: Oropharynx is clear and moist.  Eyes: Conjunctivae are normal. Right eye exhibits no discharge. Left eye exhibits no discharge. No scleral icterus.  Neck: Neck supple. No tracheal deviation present.  Cardiovascular: Normal rate, regular rhythm and intact distal pulses.   Pulmonary/Chest: Effort normal and breath sounds normal. No stridor. No respiratory distress. She has no wheezes. She has no rales.  Abdominal: Soft. Bowel sounds are normal. She exhibits no distension. There is no tenderness. There is no  rebound and no guarding.  Musculoskeletal: She exhibits no edema or tenderness.  Neurological: She is alert and oriented to person, place, and time. She has normal strength. No cranial nerve deficit (No facial droop, extraocular movements intact, tongue midline ) or sensory deficit. She exhibits normal muscle tone. She displays no seizure activity. Coordination normal.  No pronator drift bilateral upper extrem, able to hold both legs off bed for 5 seconds, sensation intact in all extremities, no visual field cuts, no left or right sided neglect, normal finger-nose exam bilaterally, no nystagmus noted   Skin: Skin is warm and dry. No rash noted.  Psychiatric: She has a normal mood and affect.  Nursing note and vitals reviewed.    ED Treatments / Results  Labs (all labs ordered are listed, but only abnormal results are displayed) Labs Reviewed  BASIC METABOLIC PANEL - Abnormal; Notable for the following:       Result Value   Potassium 3.1 (*)    All other components within normal limits  CBC WITH DIFFERENTIAL/PLATELET    EKG  EKG Interpretation  Date/Time:  Sunday August 03 2016 18:23:09 EST Ventricular Rate:  56 PR Interval:    QRS Duration: 98 QT Interval:  456 QTC Calculation: 441 R Axis:   54 Text Interpretation:  Sinus rhythm Low voltage, precordial leads Borderline T abnormalities, inferior leads Baseline wander in lead(s) I III aVL nonspecific t wave changes No significant change since last tracing Confirmed by Moises Terpstra  MD-J, Jerlyn Pain ([id number]) on 08/03/2016 6:35:28 PM       Radiology No results found.  Procedures Procedures (including critical care time)  Medications Ordered in ED Medications  potassium chloride SA (K-DUR,KLOR-CON) CR tablet 40 mEq (40 mEq Oral Given 08/03/16 1934)  amLODipine (NORVASC) tablet 5 mg (5 mg Oral Given 08/03/16 2029)     Initial Impression / Assessment and Plan / ED Course  I have reviewed the triage vital signs and the nursing  notes.  Pertinent labs & imaging results that were available during my care of the patient were reviewed by me and considered in my medical decision making (see chart for details).   Pr presented with lightheadedness and HTN.  Normal neuro exam.  No sx to suggest stroke or TIA.  No anemia, dehydration or acute renal abnormality on labs.  Mild hypokalemia noted.  Will dc home with HTN meds.    Final Clinical Impressions(s) / ED Diagnoses   Final diagnoses:  Hypokalemia  Essential hypertension    New Prescriptions Discharge Medication List as of 08/03/2016  7:46 PM    START taking these medications   Details  triamterene-hydrochlorothiazide (MAXZIDE) 75-50 MG tablet Take 1 tablet by mouth daily., Starting Sun 08/03/2016, Print  Linwood Dibbles, MD 08/04/16 [phone]

## 2017-09-09 ENCOUNTER — Other Ambulatory Visit: Payer: Self-pay | Admitting: Internal Medicine

## 2017-09-09 DIAGNOSIS — Z1231 Encounter for screening mammogram for malignant neoplasm of breast: Secondary | ICD-10-CM

## 2017-09-30 ENCOUNTER — Ambulatory Visit
Admission: RE | Admit: 2017-09-30 | Discharge: 2017-09-30 | Disposition: A | Discharge status: Home / Self Care | Payer: BLUE CROSS/BLUE SHIELD | Source: Ambulatory Visit | Attending: Internal Medicine | Admitting: Internal Medicine

## 2017-09-30 DIAGNOSIS — Z1231 Encounter for screening mammogram for malignant neoplasm of breast: Secondary | ICD-10-CM

## 2022-07-21 ENCOUNTER — Other Ambulatory Visit: Payer: Self-pay | Admitting: Internal Medicine

## 2022-07-21 ENCOUNTER — Ambulatory Visit
Admission: RE | Admit: 2022-07-21 | Discharge: 2022-07-21 | Disposition: A | Discharge status: Home / Self Care | Payer: BLUE CROSS/BLUE SHIELD | Source: Ambulatory Visit | Attending: Internal Medicine | Admitting: Internal Medicine

## 2022-07-21 DIAGNOSIS — R058 Other specified cough: Secondary | ICD-10-CM

## 2022-09-19 ENCOUNTER — Other Ambulatory Visit: Payer: Self-pay | Admitting: Internal Medicine

## 2022-09-19 ENCOUNTER — Ambulatory Visit
Admission: RE | Admit: 2022-09-19 | Discharge: 2022-09-19 | Disposition: A | Discharge status: Home / Self Care | Payer: BLUE CROSS/BLUE SHIELD | Source: Ambulatory Visit | Attending: Internal Medicine | Admitting: Internal Medicine

## 2022-09-19 DIAGNOSIS — Z8701 Personal history of pneumonia (recurrent): Secondary | ICD-10-CM

## 2023-04-02 ENCOUNTER — Other Ambulatory Visit (HOSPITAL_COMMUNITY): Payer: Self-pay | Admitting: Internal Medicine

## 2023-04-02 DIAGNOSIS — S0990XA Unspecified injury of head, initial encounter: Secondary | ICD-10-CM

## 2023-04-06 ENCOUNTER — Encounter (HOSPITAL_COMMUNITY): Payer: Self-pay

## 2023-04-06 ENCOUNTER — Ambulatory Visit (HOSPITAL_COMMUNITY)
Admission: RE | Admit: 2023-04-06 | Discharge: 2023-04-06 | Disposition: A | Discharge status: Home / Self Care | Payer: BC Managed Care – PPO | Source: Ambulatory Visit | Attending: Internal Medicine | Admitting: Internal Medicine

## 2023-04-06 DIAGNOSIS — S0990XA Unspecified injury of head, initial encounter: Secondary | ICD-10-CM | POA: Insufficient documentation

## 2023-08-05 ENCOUNTER — Other Ambulatory Visit: Payer: Self-pay | Admitting: Internal Medicine

## 2023-08-05 DIAGNOSIS — Z1231 Encounter for screening mammogram for malignant neoplasm of breast: Secondary | ICD-10-CM

## 2023-08-20 ENCOUNTER — Ambulatory Visit
Admission: RE | Admit: 2023-08-20 | Discharge: 2023-08-20 | Disposition: A | Discharge status: Home / Self Care | Payer: BC Managed Care – PPO | Source: Ambulatory Visit | Attending: Internal Medicine | Admitting: Internal Medicine

## 2023-08-20 DIAGNOSIS — Z1231 Encounter for screening mammogram for malignant neoplasm of breast: Secondary | ICD-10-CM
